# Patient Record
Sex: Female | Born: 1960 | Race: White | Hispanic: No | Smoking: Never smoker
Health system: Southern US, Community
[De-identification: ages and names within clinical notes are randomized; demographics above are authoritative.]

## PROBLEM LIST (undated history)

## (undated) HISTORY — PX: KNEE SURGERY: SHX244

---
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
## 2007-11-16 ENCOUNTER — Inpatient Hospital Stay (HOSPITAL_COMMUNITY): Admission: EM | Admit: 2007-11-16 | Discharge: 2007-11-19 | Payer: Self-pay | Admitting: Emergency Medicine

## 2007-11-18 ENCOUNTER — Ambulatory Visit: Payer: Self-pay | Admitting: Thoracic Surgery

## 2007-11-24 ENCOUNTER — Encounter: Admission: RE | Admit: 2007-11-24 | Discharge: 2007-11-24 | Payer: Self-pay | Admitting: Thoracic Surgery

## 2007-11-24 ENCOUNTER — Ambulatory Visit: Payer: Self-pay | Admitting: Thoracic Surgery

## 2007-12-14 ENCOUNTER — Ambulatory Visit: Payer: Self-pay | Admitting: Thoracic Surgery

## 2007-12-14 ENCOUNTER — Encounter: Admission: RE | Admit: 2007-12-14 | Discharge: 2007-12-14 | Payer: Self-pay | Admitting: Surgery

## 2009-07-15 IMAGING — CR DG CHEST 2V
2 series · 2 of 2 positions shown · non-contrast
Comparison: 11/24/2007

CLINICAL DATA: 47-year-old female left traumatic pneumothorax,
follow-up exam

CHEST - 2 VIEW

[view not recorded (1 of 2)]
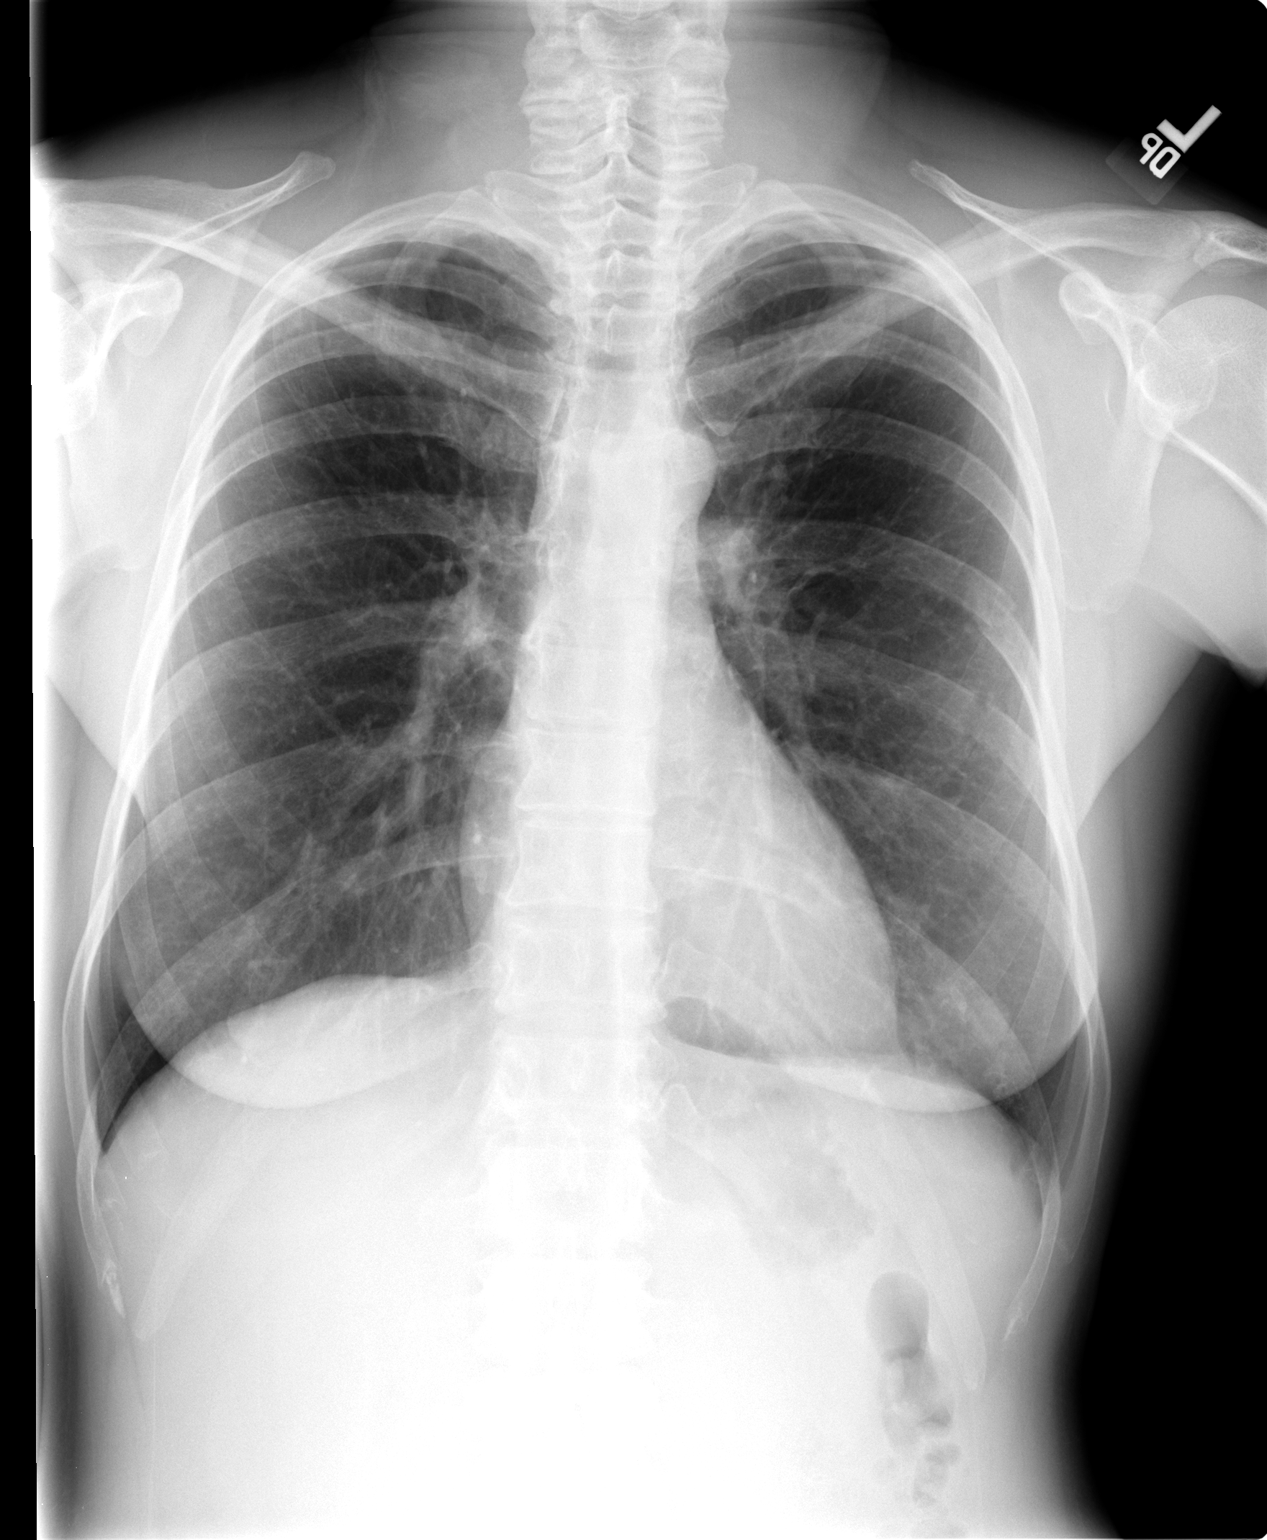

[view not recorded (2 of 2)]
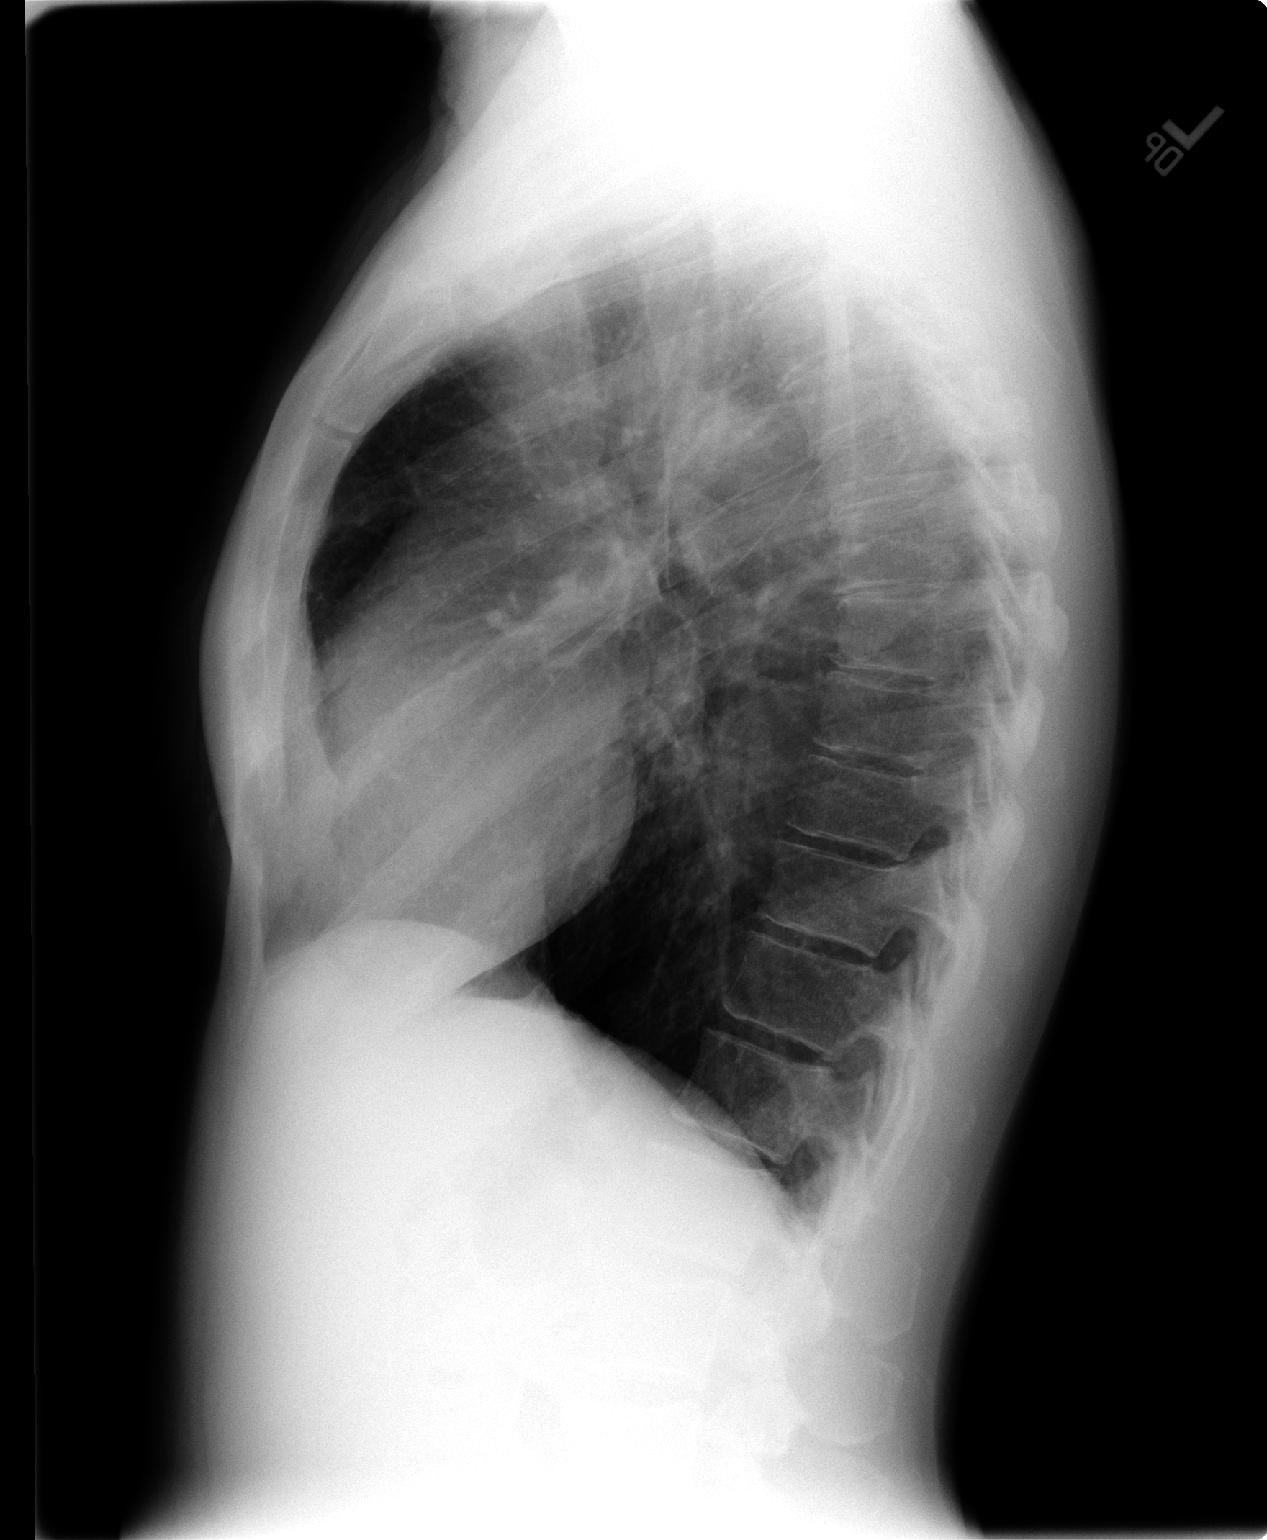

[2 of 2 positions shown; findings below may reference images not displayed]

FINDINGS: Resolved left apical pneumothorax and left chest
subcutaneous air.  Lungs are clear.  Normal heart size and
vascularity.  Lower chest symmetric nodular densities are
consistent with nipple shadows.  No current airspace disease,
edema, or effusion.  Trachea is midline.  Minimal degenerative
changes of the spine. A left posterior lateral seventh rib fracture
is again noted.
IMPRESSION: Resolved left pneumothorax and left chest subcutaneous air.

No current acute chest disease.

## 2010-07-23 NOTE — Assessment & Plan Note (Signed)
OFFICE VISIT   SICILY, ZARAGOZA  DOB:  [birth year]                                        December 14, 2007  CHART #:  [id number]   The patient comes in today for a 3-week followup.  She sustained a  traumatic left pneumothorax and was hospitalized from 11/16/2007 until  11/19/2007 for observation.  She has been stable at home and has resumed  her normal activities.  She denies any shortness of breath or chest  pain.   PHYSICAL EXAMINATION:  VITAL SIGNS:  Blood pressure is 143/89, pulse is  56, respirations 18, and O2 sat 100%.   A chest x-ray today shows complete resolution of her left pneumothorax  and all left subcutaneous air.   ASSESSMENT/PLAN:  Dr. Edwyna Shell has seen the patient and reviewed her films  today.  We will see her back as needed for followup.   Ines Bloomer, M.D.  Electronically Signed   GC/MEDQ  D:  12/14/2007  T:  12/14/2007  Job:  [id number]

## 2010-07-23 NOTE — Discharge Summary (Signed)
Alexandra Perry, Alexandra Perry            ACCOUNT NO.:  [id number]   MEDICAL RECORD NO.:  [id number]          PATIENT TYPE:  INP   LOCATION:  2022                         FACILITY:  [facility]   PHYSICIAN:  Ines Bloomer, M.D. DATE OF BIRTH:  1960/05/20   DATE OF ADMISSION:  11/16/2007  DATE OF DISCHARGE:                               DISCHARGE SUMMARY   HISTORY:  The patient is a 50 year old white female who approximately 24  hours prior to admission fell from her lifeguard platform and landed on  the left side of her chest.  She was seen in Urgent Care on November 16, 2007, and chest x-ray revealed a left-sided pneumothorax.  She  complained primarily of discomfort on her left side as well as some  bruising down to the level of her buttocks.  She denied significant  shortness of breath.  She denied cough or hemoptysis.  She had no  fevers, chills, or other constitutional symptoms.  She had some mild  abdominal discomfort.  She was seen in the emergency room and felt to  require admission for further observation.   PAST MEDICAL HISTORY:  Unremarkable.   PAST SURGICAL HISTORY:  Right knee arthroscopy and also appendectomy.   ALLERGIES:  No known drug allergies.   MEDICATIONS PRIOR TO ADMISSION:  None.   FAMILY HISTORY, SOCIAL HISTORY, REVIEW OF SYMPTOMS, AND PHYSICAL  EXAMINATION:  Please see history and physical done at the time of  admission.   HOSPITAL COURSE:  The patient was admitted.  She was seen in the  emergency room by Dr. Edwyna Shell.  It was not felt that she would require a  chest tube due to the overall small size in the pneumothorax.  A CT scan  of the chest was obtained.  This was notable from the following:  1. Approximately 15-20% left pneumothorax under mild tension.  2. Pneumomediastinum and extensive left subcutaneous emphysema.  3. Left seventh, eighth, and ninth rib fractures as well as left T8      and T9 transverse process fractures.  4. Left lower lobe pulmonary  contusion.  5. Minimal blood in the left pleural space.  6. Three small right lung nodules.  Per the radiology recommendation,      if the patient is a nonsmoker, a followup noncontrast CT would be      advised for 6-12 months, or and a smoker a followup CT without      contrast would be recommended in 6 months.  The patient is a      nonsmoker.   HOSPITAL COURSE:  The patient was admitted.  She has overall had no  significant difficulties.  She has had moderate pain, but this has been  managed with analgesia as well as muscle relaxants.  A urinalysis did  reveal blood, however, there was only 0-2 RBCs per high-powered field.  She required some hydration.  She was started on a course of incentive  spirometry and pulmonary toilet.  She has remained afebrile.  Activity  level has slowly been improving.  Overall, the patient's status was felt  to be tentatively stable for  discharge on November 19, 2007, pending  morning round reevaluation.   DISCHARGE MEDICATIONS:  1. Tylox 1-2 every 4-6 hours as needed.  2. Flexeril 10 mg 1/2 to 1 tablet every 8 hours as needed.  3. Followup will be Wednesday, November 24, 2007, at 2:45, with Dr.      Edwyna Shell with a chest x-ray.   INSTRUCTIONS:  The patient received written instructions in regard to  medications, activity, diet, wound care, and followup.   FINAL DIAGNOSIS:  Chest trauma following a fall.   OTHER DIAGNOSES:  1. Left-sided 15-20% pneumothorax, stable.  2. Pneumomediastinum.  3. Extensive subcutaneous emphysema.  4. Left seventh, eighth, and ninth rib fracture.  5. Left T8 and T9 transverse process fractures.  6. Left lower lobe pulmonary contusion.  7. Three small right lung nodules that will require followup in the      future by repeat CT scan.  8. Previous history of appendectomy.  9. Previous history of right knee arthroscopy.      Rowe Clack, P.A.-C.      Ines Bloomer, M.D.  Electronically Signed     WEG/MEDQ  D:  11/18/2007  T:  11/19/2007  Job:  [id number]   cc:   Cherylynn Ridges, M.D.

## 2010-07-23 NOTE — Assessment & Plan Note (Signed)
OFFICE VISIT   Alexandra Perry, Alexandra Perry  DOB:  12-09-1960                                        November 24, 2007  CHART #:  [id number]   The patient came today for followup of her traumatic left pneumothorax.  She had fractures of her seventh, eighth, and ninth ribs on the left  side with displacement of the eighth rib.  She develops subcutaneous  emphysema and pneumomediastinum.  Her chest x-ray today shows that her  pneumothorax has pretty much resolved and there is a decreasing  subcutaneous emphysema and no more pneumomediastinum.  Her lungs are  clear to auscultation and percussion.  Heart regular sinus rhythm.  No  murmurs.  Abdomen is soft.  She is doing well overall.  Her blood  pressure is 110/74, pulse 80, respirations 18, and sats were 99%.  I  will see her back again in 3 weeks with another chest x-ray.  I will  release her to normal activity.   Ines Bloomer, M.D.  Electronically Signed   DPB/MEDQ  D:  11/24/2007  T:  11/25/2007  Job:  [id number]

## 2010-07-23 NOTE — H&P (Signed)
Alexandra Perry, Alexandra Perry            ACCOUNT NO.:  [id number]   MEDICAL RECORD NO.:  [id number]          PATIENT TYPE:  EMS   LOCATION:  MAJO                         FACILITY:  [facility]   PHYSICIAN:  Ines Bloomer, M.D. DATE OF BIRTH:  05/15/1960   DATE OF ADMISSION:  11/16/2007  DATE OF DISCHARGE:                              HISTORY & PHYSICAL   CHIEF COMPLAINT:  Traumatic left-sided pneumothorax.   HISTORY OF PRESENT ILLNESS:  The patient is a 50 year old white female  who approximately 24 hours ago fell from her lifeguard platform and  landed on her left side of her chest.  She was seen in Urgent Care on  today's date, where a chest x-ray revealed a left-sided pneumothorax.  She complains primarily of discomfort in the left side as well as some  bruising down to the level of her buttocks.  She denies significant  shortness of breath.  She denies cough or hemoptysis.  She has had no  fevers, chills, or other constitutional symptoms.  She has some mild  abdominal discomfort.  She will be admitted for possible chest tube  placement versus observation.  She will be seen in the emergency  department by Dr. Edwyna Shell for this decision to be made.   PAST MEDICAL HISTORY:  Unremarkable.   PAST SURGICAL HISTORY:  She has had right knee arthroscopy and  appendectomy.   ALLERGIES:  No known drug allergies.   MEDICATIONS:  Prior to admission none.   REVIEW OF SYSTEMS:  See the history of present illness for pertinent  positives or negatives, otherwise unremarkable.   SOCIAL HISTORY:  She is married with two grown children.  Tobacco use,  none.  Alcohol use, one to two beers per evening.  Drug use, none.   FAMILY HISTORY:  Remarkable for father deceased from congestive heart  failure.  Father deceased from lung cancer.   PHYSICAL EXAMINATION:  VITAL SIGNS:  Blood pressure 125/73, heart rate  77, respirations 18, and temperature 99.1.  GENERAL APPEARANCE:  A 50 year old white female in  no acute distress at  rest.  HEENT:  Normocephalic, atraumatic.  Pupils equal, round, react to light.  Extraocular movements intact.  Oral mucosa is pink and moist.  Sclerae  is nonicteric.  Pharynx is clear of exudates or erythema.  NECK:  Supple.  No jugular venous distention, no bruits.  No  lymphadenopathy.  CARDIAC:  Regular rate and rhythm.  Normal S1 and S2 without murmurs,  gallops, or rubs.  PULMONARY:  Symmetrical on respiration, unlabored.  There is  subcutaneous air in the left side anterior and posterior thorax.  Breath  sounds are diminished on the left.  They are clear on the right.  There  are no wheezes, no rhonchi.  ABDOMEN:  Soft.  There is mild left upper quadrant tenderness to  palpation.  No masses, no bruits.  No guarding, no rebound.  GENITOURINARY/RECTAL:  Deferred.  EXTREMITIES:  No edema.  Peripheral pulses are equal and intact  bilaterally.  NEUROLOGIC:  Nonfocal.  She is alert and oriented x4.  Gait was not  tested.  Muscle strength was  equal bilaterally grossly.   ASSESSMENT:  Traumatic left pneumothorax.   PLAN:  Plan is for observation with possible chest tube placement after  evaluation by Dr. Edwyna Shell.      Rowe Clack, P.A.-C.      Ines Bloomer, M.D.  Electronically Signed    WEG/MEDQ  D:  11/16/2007  T:  11/17/2007  Job:  [id number]

## 2010-12-11 LAB — URINALYSIS, ROUTINE W REFLEX MICROSCOPIC
Bilirubin Urine: NEGATIVE
Glucose, UA: NEGATIVE
Ketones, ur: 40 — AB
Ketones, ur: NEGATIVE
Protein, ur: 30 — AB
Protein, ur: NEGATIVE
Protein, ur: NEGATIVE
Specific Gravity, Urine: >1.046 — ABNORMAL HIGH
Urobilinogen, UA: 1
pH: 5
pH: 5.5

## 2010-12-11 LAB — CBC
HCT: 34.2 — ABNORMAL LOW
MCHC: 35.1
MCV: 96.7
MCV: 96.7
Platelets: 243
RBC: 3.54 — ABNORMAL LOW
WBC: 9.2

## 2010-12-11 LAB — URINE MICROSCOPIC-ADD ON

## 2010-12-11 LAB — COMPREHENSIVE METABOLIC PANEL
AST: 49 — ABNORMAL HIGH
Albumin: 3.6
BUN: 8
Chloride: 108
GFR calc non Af Amer: >60
Sodium: 138

## 2010-12-11 LAB — BASIC METABOLIC PANEL
CO2: 26
Calcium: 8.6
Chloride: 106
GFR calc Af Amer: >60
Potassium: 4.3
Sodium: 137

## 2010-12-11 LAB — PROTIME-INR: INR: 1

## 2010-12-11 LAB — DIFFERENTIAL
Basophils Relative: 0
Neutrophils Relative %: 83 — ABNORMAL HIGH

## 2010-12-11 LAB — APTT: aPTT: 25

## 2016-06-19 ENCOUNTER — Ambulatory Visit (INDEPENDENT_AMBULATORY_CARE_PROVIDER_SITE_OTHER): Payer: BLUE CROSS/BLUE SHIELD | Admitting: Nurse Practitioner

## 2016-06-19 ENCOUNTER — Encounter: Payer: Self-pay | Admitting: Nurse Practitioner

## 2016-06-19 VITALS — BP 122/84 | HR 69 | Temp 98.3°F | Ht 63.0 in | Wt 145.0 lb

## 2016-06-19 DIAGNOSIS — G8929 Other chronic pain: Secondary | ICD-10-CM

## 2016-06-19 DIAGNOSIS — Z0000 Encounter for general adult medical examination without abnormal findings: Secondary | ICD-10-CM

## 2016-06-19 DIAGNOSIS — M25561 Pain in right knee: Secondary | ICD-10-CM | POA: Diagnosis not present

## 2016-06-19 DIAGNOSIS — R479 Unspecified speech disturbances: Secondary | ICD-10-CM | POA: Diagnosis not present

## 2016-06-19 DIAGNOSIS — M25562 Pain in left knee: Secondary | ICD-10-CM

## 2016-06-19 NOTE — Progress Notes (Signed)
Pre visit review using our clinic review tool, if applicable. No additional management support is needed unless otherwise documented below in the visit note. 

## 2016-06-19 NOTE — Progress Notes (Signed)
Subjective:    Patient ID: Alexandra Perry, female    DOB: 1960/10/24, 56 y.o.   MRN: [id number]  Patient presents today for establish care (new patient)  HPI  no previous pcp.  Speech Disturbance: Experienced a period of aphasia and light sensitvity while at work, happened 4weeks ago, lasted for about . No change in LOC, no change in vision, no weakness or fatigue. Was not witnessed by anyone. No FH of CAD or CVA or TIA or seizure. No personal hx of HA or migraines  Knee pain: Ongoing for several years, bilateral knee pain with stffness and clicking sounds.  Left knee worse than right. No knee injury, no swelling, no redness. Exercises regularly (elliptical and stationary bike).  Immunizations: (TDAP, Hep C screen, Pneumovax, Influenza, zoster)  Health Maintenance  Topic Date Due  .  Hepatitis C: One time screening is recommended by Center for Disease Control  (CDC) for  adults born from [birth year] through 1967.   September 15, 1960  . HIV Screening  04/25/1973  . Pap Smear  04/26/1979  . Mammogram  04/25/2008  . Colon Cancer Screening  04/25/2008  . Tetanus Vaccine  10/08/2008  . Flu Shot  10/08/2016   Diet:healthy Weight:  Wt Readings from Last 3 Encounters:  06/19/16 145 lb (65.8 kg)   Exercise:daily Fall Risk: Fall Risk  06/19/2016  Falls in the past year? No   Depression/Suicide: Depression screen PHQ 2/9 06/19/2016  Decreased Interest 0  Down, Depressed, Hopeless 0  PHQ - 2 Score 0   Medications and allergies reviewed with patient and updated if appropriate.  Patient Active Problem List   Diagnosis Date Noted  . Speech disturbance 06/19/2016  . Chronic pain of both knees 06/19/2016    No current outpatient prescriptions on file prior to visit.   No current facility-administered medications on file prior to visit.     History reviewed. No pertinent past medical history.  Past Surgical History:  Procedure Laterality Date  . APPENDECTOMY  1966  . KNEE  SURGERY      Social History   Social History  . Marital status: Divorced    Spouse name: N/A  . Number of children: N/A  . Years of education: N/A   Social History Main Topics  . Smoking status: Never Smoker  . Smokeless tobacco: Never Used  . Alcohol use 0.6 oz/week    1 Cans of beer per week     Comment: social  . Drug use: No  . Sexual activity: Not Currently   Other Topics Concern  . None   Social History Narrative  . None    Family History  Problem Relation Age of Onset  . Cancer Mother     lung cancer  . Heart disease Father 69    CAD , no MI with CABG  . Alcohol abuse Sister   . Eating disorder Sister   . Drug abuse Sister   . Parkinson's disease Maternal Grandmother 60        Review of Systems  Constitutional: Negative for fever, malaise/fatigue and weight loss.  HENT: Negative for congestion, hearing loss, sore throat and tinnitus.   Eyes: Negative for blurred vision and photophobia.       Negative for visual changes  Respiratory: Negative for cough and shortness of breath.   Cardiovascular: Negative for chest pain, palpitations and leg swelling.  Gastrointestinal: Negative for blood in stool, constipation, diarrhea and heartburn.  Genitourinary: Negative for dysuria, frequency and urgency.  Musculoskeletal:  Positive for joint pain. Negative for falls and myalgias.  Skin: Negative for rash.  Neurological: Negative for dizziness, sensory change, weakness and headaches.  Endo/Heme/Allergies: Negative for environmental allergies. Does not bruise/bleed easily.  Psychiatric/Behavioral: Negative for depression, substance abuse and suicidal ideas. The patient is not nervous/anxious.     Objective:   Vitals:   06/19/16 0913  BP: 122/84  Pulse: 69  Temp: 98.3 F (36.8 C)    Body mass index is 25.69 kg/m.   Physical Examination:  Physical Exam  Constitutional: She is oriented to person, place, and time and well-developed, well-nourished, and in  no distress. No distress.  HENT:  Right Ear: External ear normal.  Left Ear: External ear normal.  Nose: Nose normal.  Mouth/Throat: Oropharynx is clear and moist. No oropharyngeal exudate.  Eyes: Conjunctivae and EOM are normal. Pupils are equal, round, and reactive to light. No scleral icterus.  Neck: Normal range of motion. Neck supple. No thyromegaly present.  Cardiovascular: Normal rate, normal heart sounds and intact distal pulses.   Pulmonary/Chest: Effort normal and breath sounds normal. She exhibits no tenderness.  Abdominal: Soft. Bowel sounds are normal. She exhibits no distension. There is no tenderness.  Musculoskeletal: Normal range of motion. She exhibits no edema or tenderness.       Right knee: Normal.       Left knee: Normal.  Lymphadenopathy:    She has no cervical adenopathy.  Neurological: She is alert and oriented to person, place, and time. Gait normal.  Skin: Skin is warm and dry.  Psychiatric: Affect and judgment normal.  Vitals reviewed.   ASSESSMENT and PLAN:  Mosella was seen today for establish care.  Diagnoses and all orders for this visit:  Encounter for medical examination to establish care -     Cancel: Lipid panel; Future -     Cancel: Comprehensive metabolic panel; Future -     Cancel: TSH; Future  Speech disturbance, unspecified type -     Cancel: Lipid panel; Future -     Cancel: Comprehensive metabolic panel; Future -     Cancel: TSH; Future  Chronic pain of both knees   No problem-specific Assessment & Plan notes found for this encounter.     Follow up: Return in about 7 days (around 06/26/2016) for CPE (fasting, breast exam and PAP).  Alysia Penna, NP

## 2016-06-19 NOTE — Patient Instructions (Addendum)
She denied any labs and head CT today due to possible high copay She also denied referral to ortho or PT or sports medicine to address knee pain.  Aphasia Aphasia is damage to the part of your brain that you need to communicate. For most people, that area is on the left side of the brain. Aphasia does not affect your intelligence, but you may struggle to talk, understand speech, read, or write. Aphasia can happen to anyone at any age, but it is most common in older age. What are the causes? An interruption of blood supply to the brain (stroke) is the most common cause of aphasia. Any disease or disorder that damages the communication areas of the brain can cause aphasia. This includes:  Brain tumors.  Brain injuries.  Brain infections.  Progressive diseases of the nervous system (neurological disorders). What increases the risk? You may be at risk for aphasia if you have had any trauma, disease, or disorder that damaged the communication areas of the brain. What are the signs or symptoms? Aphasia may start suddenly if it is caused by a stroke or brain injury. Aphasia caused by a tumor or a progressive neurological disorder may start gradually. The condition affects people differently. Signs and symptoms of aphasia include:  Trouble finding the right word.  Using the wrong words.  Talking in sentences that do not make sense.  Making up words.  Being unable to understand other people's speech.  Having problems writing, spelling, or reading.  Having trouble with numbers.  Having trouble swallowing. How is this diagnosed? Your health care provider may suspect you have aphasia if you lose the ability to speak or understand language. You may need to see a specialist (speech and language pathologist) to help determine the diagnosis of aphasia. This person may do a series of tests to check your ability to:  Speak.  Express ideas.  Make conversation.  Understand speech.  Read and  write. How is this treated? In some cases, aphasia may improve on its own over time. Treatment for aphasia usually involves therapy with a pathologist. Your treatment will be designed to meet your needs and abilities. Common treatments include:  Speech therapy.  Learning other ways to communicate.  Working with family members to find the best ways to communicate.  Working with an occupational therapist to find ways to communicate at work. Follow these instructions at home:  Keep all follow-up appointments.  Make sure you have a good support system at home.  The following techniques may be helpful while communicating:  Use short, simple sentences. Ask family members to do the same. Sentences that require one-word answers are easiest.  Avoid distractions like background noise when trying to listen or talk.  Try communicating with gestures, pointing, or drawing.  Talk slowly. Ask family members to talk to you slowly.  Maintain eye contact when communicating. Contact a health care provider if:  Your symptoms change or get worse.  You need more support at home.  You are struggling with anxiety or depression.  You develop trouble swallowing. This information is not intended to replace advice given to you by your health care provider. Make sure you discuss any questions you have with your health care provider. Document Released: 11/16/2001 Document Revised: 09/14/2015 Document Reviewed: 05/16/2013 Elsevier Interactive Patient Education  2017 [employer].

## 2016-06-26 ENCOUNTER — Encounter: Payer: Self-pay | Admitting: Nurse Practitioner

## 2016-06-26 ENCOUNTER — Other Ambulatory Visit (INDEPENDENT_AMBULATORY_CARE_PROVIDER_SITE_OTHER): Payer: BLUE CROSS/BLUE SHIELD

## 2016-06-26 ENCOUNTER — Ambulatory Visit (INDEPENDENT_AMBULATORY_CARE_PROVIDER_SITE_OTHER): Payer: BLUE CROSS/BLUE SHIELD | Admitting: Nurse Practitioner

## 2016-06-26 VITALS — BP 124/80 | HR 81 | Temp 98.1°F | Ht 63.0 in | Wt 147.0 lb

## 2016-06-26 DIAGNOSIS — Z0000 Encounter for general adult medical examination without abnormal findings: Secondary | ICD-10-CM

## 2016-06-26 DIAGNOSIS — Z1231 Encounter for screening mammogram for malignant neoplasm of breast: Secondary | ICD-10-CM | POA: Diagnosis not present

## 2016-06-26 DIAGNOSIS — Z136 Encounter for screening for cardiovascular disorders: Secondary | ICD-10-CM

## 2016-06-26 DIAGNOSIS — Z13220 Encounter for screening for lipoid disorders: Secondary | ICD-10-CM

## 2016-06-26 LAB — COMPREHENSIVE METABOLIC PANEL
ALBUMIN: 4.4 g/dL (ref 3.5–5.2)
ALT: 17 U/L (ref 0–35)
AST: 18 U/L (ref 0–37)
Alkaline Phosphatase: 73 U/L (ref 39–117)
BUN: 10 mg/dL (ref 6–23)
CALCIUM: 9.4 mg/dL (ref 8.4–10.5)
CHLORIDE: 101 meq/L (ref 96–112)
CO2: 26 meq/L (ref 19–32)
Creatinine, Ser: 0.84 mg/dL (ref 0.40–1.20)
GFR: 73.97 mL/min (ref >60.00)
Glucose, Bld: 90 mg/dL (ref 70–99)
POTASSIUM: 4.3 meq/L (ref 3.5–5.1)
SODIUM: 135 meq/L (ref 135–145)
TOTAL PROTEIN: 7.2 g/dL (ref 6.0–8.3)
Total Bilirubin: 0.5 mg/dL (ref 0.2–1.2)

## 2016-06-26 LAB — CBC WITH DIFFERENTIAL/PLATELET
BASOS PCT: 0.2 % (ref 0.0–3.0)
Basophils Absolute: 0 10*3/uL (ref 0.0–0.1)
EOS PCT: 2.9 % (ref 0.0–5.0)
Eosinophils Absolute: 0.2 10*3/uL (ref 0.0–0.7)
HCT: 36.4 % (ref 36.0–46.0)
HEMOGLOBIN: 12.6 g/dL (ref 12.0–15.0)
Lymphocytes Relative: 23.8 % (ref 12.0–46.0)
Lymphs Abs: 1.5 10*3/uL (ref 0.7–4.0)
MCHC: 34.8 g/dL (ref 30.0–36.0)
MCV: 91.9 fl (ref 78.0–100.0)
MONO ABS: 0.4 10*3/uL (ref 0.1–1.0)
Monocytes Relative: 7 % (ref 3.0–12.0)
Neutro Abs: 4.1 10*3/uL (ref 1.4–7.7)
Neutrophils Relative %: 66.1 % (ref 43.0–77.0)
Platelets: 323 10*3/uL (ref 150.0–400.0)
RBC: 3.96 Mil/uL (ref 3.87–5.11)
RDW: 13.6 % (ref 11.5–15.5)
WBC: 6.2 10*3/uL (ref 4.0–10.5)

## 2016-06-26 LAB — LIPID PANEL
CHOL/HDL RATIO: 3
Cholesterol: 251 mg/dL — ABNORMAL HIGH (ref 0–200)
HDL: 80.1 mg/dL (ref >39.00)
LDL CALC: 158 mg/dL — AB (ref 0–99)
NONHDL: 171.35
Triglycerides: 68 mg/dL (ref 0.0–149.0)
VLDL: 13.6 mg/dL (ref 0.0–40.0)

## 2016-06-26 LAB — TSH: TSH: 2.29 u[IU]/mL (ref 0.35–4.50)

## 2016-06-26 NOTE — Progress Notes (Signed)
Pre visit review using our clinic review tool, if applicable. No additional management support is needed unless otherwise documented below in the visit note. 

## 2016-06-26 NOTE — Progress Notes (Signed)
Subjective:    Patient ID: Alexandra Perry, female    DOB: 06-25-1960, 56 y.o.   MRN: [id number]  Patient presents today for complete physical.  HPI  denies any acute complaints.  Immunizations: (TDAP, Hep C screen, Pneumovax, Influenza, zoster)  Health Maintenance  Topic Date Due  . Mammogram  04/25/2008  . Pap Smear  06/08/2017*  . Colon Cancer Screening  06/08/2017*  . Tetanus Vaccine  06/08/2017*  .  Hepatitis C: One time screening is recommended by Center for Disease Control  (CDC) for  adults born from [birth year] through 1965.   06/08/2017*  . HIV Screening  06/08/2017*  . Flu Shot  10/08/2016  *Topic was postponed. The date shown is not the original due date.   Diet:regular Weight:  Wt Readings from Last 3 Encounters:  06/26/16 147 lb (66.7 kg)  06/19/16 145 lb (65.8 kg)   Exercise:none Fall Risk: Fall Risk  06/19/2016  Falls in the past year? No   Home Safety:home alone Depression/Suicide: Depression screen [facility] 2/9 06/19/2016  Decreased Interest 0  Down, Depressed, Hopeless 0  PHQ - 2 Score 0   No flowsheet data found. Colonoscopy (every 5-41yrs, >50-50yrs):needed, but declined Pap Smear (every 71yrs for >21-29 without HPV, every 32yrs for >30-66yrs with HPV):needed but declined Mammogram (yearly, >48yrs):needed Vision:needed Dental:needed Sexual History (birth control, marital status, STD):not sexually active  Medications and allergies reviewed with patient and updated if appropriate.  Patient Active Problem List   Diagnosis Date Noted  . Speech disturbance 06/19/2016  . Chronic pain of both knees 06/19/2016    Current Outpatient Prescriptions on File Prior to Visit  Medication Sig Dispense Refill  . aspirin 325 MG tablet Take 325 mg by mouth daily.     No current facility-administered medications on file prior to visit.     No past medical history on file.  Past Surgical History:  Procedure Laterality Date  . APPENDECTOMY  1966  . KNEE SURGERY       Social History   Social History  . Marital status: Divorced    Spouse name: N/A  . Number of children: N/A  . Years of education: N/A   Social History Main Topics  . Smoking status: Never Smoker  . Smokeless tobacco: Never Used  . Alcohol use 0.6 oz/week    1 Cans of beer per week     Comment: social  . Drug use: No  . Sexual activity: Not Currently   Other Topics Concern  . None   Social History Narrative  . None    Family History  Problem Relation Age of Onset  . Cancer Mother     lung cancer  . Heart disease Father 12    CAD , no MI with CABG  . Alcohol abuse Sister   . Eating disorder Sister   . Drug abuse Sister   . Parkinson's disease Maternal Grandmother 60        Review of Systems  Constitutional: Negative for fever, malaise/fatigue and weight loss.  HENT: Negative for congestion and sore throat.   Eyes:       Negative for visual changes  Respiratory: Negative for cough and shortness of breath.   Cardiovascular: Negative for chest pain, palpitations and leg swelling.  Gastrointestinal: Negative for blood in stool, constipation, diarrhea and heartburn.  Genitourinary: Negative for dysuria, frequency and urgency.  Musculoskeletal: Positive for joint pain. Negative for falls and myalgias.  Skin: Negative for rash.  Neurological: Negative for dizziness,  sensory change and headaches.  Endo/Heme/Allergies: Does not bruise/bleed easily.  Psychiatric/Behavioral: Negative for depression, substance abuse and suicidal ideas. The patient is not nervous/anxious.     Objective:   Vitals:   06/26/16 0830  BP: 124/80  Pulse: 81  Temp: 98.1 F (36.7 C)    Body mass index is 26.04 kg/m.   Physical Examination:  Physical Exam  Constitutional: She is oriented to person, place, and time and well-developed, well-nourished, and in no distress. No distress.  HENT:  Right Ear: External ear normal.  Left Ear: External ear normal.  Nose: Nose normal.    Mouth/Throat: No oropharyngeal exudate.  Eyes: Conjunctivae and EOM are normal. Pupils are equal, round, and reactive to light. No scleral icterus.  Neck: Normal range of motion. Neck supple. No thyromegaly present.  Cardiovascular: Normal rate, regular rhythm, normal heart sounds and intact distal pulses.   Pulmonary/Chest: Effort normal and breath sounds normal. She exhibits no tenderness. Right breast exhibits no inverted nipple, no mass, no nipple discharge, no skin change and no tenderness. Left breast exhibits no inverted nipple, no mass, no nipple discharge, no skin change and no tenderness. Breasts are symmetrical.  Abdominal: Soft. Bowel sounds are normal. She exhibits no distension. There is no tenderness.  Musculoskeletal: Normal range of motion. She exhibits no edema or tenderness.  Lymphadenopathy:    She has no cervical adenopathy.  Neurological: She is alert and oriented to person, place, and time. Gait normal.  Skin: Skin is warm and dry.  Psychiatric: Affect and judgment normal.  Vitals reviewed.   ASSESSMENT and PLAN:  Allean was seen today for annual exam.  Diagnoses and all orders for this visit:  Encounter for preventative adult health care examination -     CBC w/Diff; Future -     Comprehensive metabolic panel; Future -     TSH; Future -     Lipid panel; Future -     MM DIGITAL SCREENING BILATERAL; Future  Encounter for lipid screening for cardiovascular disease -     Lipid panel; Future  Encounter for screening mammogram for breast cancer -     MM DIGITAL SCREENING BILATERAL; Future    No problem-specific Assessment & Plan notes found for this encounter.     Follow up: Return in about 6 months (around 12/26/2016) for speech disturbance and knee pain.  Alysia Penna, NP

## 2016-06-26 NOTE — Patient Instructions (Signed)
You will be contact about lab results.  Health Maintenance, Female Adopting a healthy lifestyle and getting preventive care can go a long way to promote health and wellness. Talk with your health care provider about what schedule of regular examinations is right for you. This is a good chance for you to check in with your provider about disease prevention and staying healthy. In between checkups, there are plenty of things you can do on your own. Experts have done a lot of research about which lifestyle changes and preventive measures are most likely to keep you healthy. Ask your health care provider for more information. Weight and diet Eat a healthy diet  Be sure to include plenty of vegetables, fruits, low-fat dairy products, and lean protein.  Do not eat a lot of foods high in solid fats, added sugars, or salt.  Get regular exercise. This is one of the most important things you can do for your health.  Most adults should exercise for at least 150 minutes each week. The exercise should increase your heart rate and make you sweat (moderate-intensity exercise).  Most adults should also do strengthening exercises at least twice a week. This is in addition to the moderate-intensity exercise. Maintain a healthy weight  Body mass index (BMI) is a measurement that can be used to identify possible weight problems. It estimates body fat based on height and weight. Your health care provider can help determine your BMI and help you achieve or maintain a healthy weight.  For females 50 years of age and older:  A BMI below 18.5 is considered underweight.  A BMI of 18.5 to 24.9 is normal.  A BMI of 25 to 29.9 is considered overweight.  A BMI of 30 and above is considered obese. Watch levels of cholesterol and blood lipids  You should start having your blood tested for lipids and cholesterol at 58 years of age, then have this test every 5 years.  You may need to have your cholesterol levels  checked more often if:  Your lipid or cholesterol levels are high.  You are older than 58 years of age.  You are at high risk for heart disease. Cancer screening Lung Cancer  Lung cancer screening is recommended for adults 54-78 years old who are at high risk for lung cancer because of a history of smoking.  A yearly low-dose CT scan of the lungs is recommended for people who:  Currently smoke.  Have quit within the past 15 years.  Have at least a 30-pack-year history of smoking. A pack year is smoking an average of one pack of cigarettes a day for 1 year.  Yearly screening should continue until it has been 15 years since you quit.  Yearly screening should stop if you develop a health problem that would prevent you from having lung cancer treatment. Breast Cancer  Practice breast self-awareness. This means understanding how your breasts normally appear and feel.  It also means doing regular breast self-exams. Let your health care provider know about any changes, no matter how small.  If you are in your 20s or 30s, you should have a clinical breast exam (CBE) by a health care provider every 1-3 years as part of a regular health exam.  If you are 94 or older, have a CBE every year. Also consider having a breast X-ray (mammogram) every year.  If you have a family history of breast cancer, talk to your health care provider about genetic screening.  If you  are at high risk for breast cancer, talk to your health care provider about having an MRI and a mammogram every year.  Breast cancer gene (BRCA) assessment is recommended for women who have family members with BRCA-related cancers. BRCA-related cancers include:  Breast.  Ovarian.  Tubal.  Peritoneal cancers.  Results of the assessment will determine the need for genetic counseling and BRCA1 and BRCA2 testing. Cervical Cancer  Your health care provider may recommend that you be screened regularly for cancer of the pelvic  organs (ovaries, uterus, and vagina). This screening involves a pelvic examination, including checking for microscopic changes to the surface of your cervix (Pap test). You may be encouraged to have this screening done every 3 years, beginning at age 46.  For women ages 24-65, health care providers may recommend pelvic exams and Pap testing every 3 years, or they may recommend the Pap and pelvic exam, combined with testing for human papilloma virus (HPV), every 5 years. Some types of HPV increase your risk of cervical cancer. Testing for HPV may also be done on women of any age with unclear Pap test results.  Other health care providers may not recommend any screening for nonpregnant women who are considered low risk for pelvic cancer and who do not have symptoms. Ask your health care provider if a screening pelvic exam is right for you.  If you have had past treatment for cervical cancer or a condition that could lead to cancer, you need Pap tests and screening for cancer for at least 20 years after your treatment. If Pap tests have been discontinued, your risk factors (such as having a new sexual partner) need to be reassessed to determine if screening should resume. Some women have medical problems that increase the chance of getting cervical cancer. In these cases, your health care provider may recommend more frequent screening and Pap tests. Colorectal Cancer  This type of cancer can be detected and often prevented.  Routine colorectal cancer screening usually begins at 58 years of age and continues through 58 years of age.  Your health care provider may recommend screening at an earlier age if you have risk factors for colon cancer.  Your health care provider may also recommend using home test kits to check for hidden blood in the stool.  A small camera at the end of a tube can be used to examine your colon directly (sigmoidoscopy or colonoscopy). This is done to check for the earliest forms  of colorectal cancer.  Routine screening usually begins at age 35.  Direct examination of the colon should be repeated every 5-10 years through 58 years of age. However, you may need to be screened more often if early forms of precancerous polyps or small growths are found. Skin Cancer  Check your skin from head to toe regularly.  Tell your health care provider about any new moles or changes in moles, especially if there is a change in a mole's shape or color.  Also tell your health care provider if you have a mole that is larger than the size of a pencil eraser.  Always use sunscreen. Apply sunscreen liberally and repeatedly throughout the day.  Protect yourself by wearing long sleeves, pants, a wide-brimmed hat, and sunglasses whenever you are outside. Heart disease, diabetes, and high blood pressure  High blood pressure causes heart disease and increases the risk of stroke. High blood pressure is more likely to develop in:  People who have blood pressure in the high end  of the normal range (130-139/85-89 mm Hg).  People who are overweight or obese.  People who are African American.  If you are 45-38 years of age, have your blood pressure checked every 3-5 years. If you are 63 years of age or older, have your blood pressure checked every year. You should have your blood pressure measured twice-once when you are at a hospital or clinic, and once when you are not at a hospital or clinic. Record the average of the two measurements. To check your blood pressure when you are not at a hospital or clinic, you can use:  An automated blood pressure machine at a pharmacy.  A home blood pressure monitor.  If you are between 62 years and 66 years old, ask your health care provider if you should take aspirin to prevent strokes.  Have regular diabetes screenings. This involves taking a blood sample to check your fasting blood sugar level.  If you are at a normal weight and have a low risk for  diabetes, have this test once every three years after 58 years of age.  If you are overweight and have a high risk for diabetes, consider being tested at a younger age or more often. Preventing infection Hepatitis B  If you have a higher risk for hepatitis B, you should be screened for this virus. You are considered at high risk for hepatitis B if:  You were born in a country where hepatitis B is common. Ask your health care provider which countries are considered high risk.  Your parents were born in a high-risk country, and you have not been immunized against hepatitis B (hepatitis B vaccine).  You have HIV or AIDS.  You use needles to inject street drugs.  You live with someone who has hepatitis B.  You have had sex with someone who has hepatitis B.  You get hemodialysis treatment.  You take certain medicines for conditions, including cancer, organ transplantation, and autoimmune conditions. Hepatitis C  Blood testing is recommended for:  Everyone born from [birth year] through 1967.  Anyone with known risk factors for hepatitis C. Sexually transmitted infections (STIs)  You should be screened for sexually transmitted infections (STIs) including gonorrhea and chlamydia if:  You are sexually active and are younger than 58 years of age.  You are older than 58 years of age and your health care provider tells you that you are at risk for this type of infection.  Your sexual activity has changed since you were last screened and you are at an increased risk for chlamydia or gonorrhea. Ask your health care provider if you are at risk.  If you do not have HIV, but are at risk, it may be recommended that you take a prescription medicine daily to prevent HIV infection. This is called pre-exposure prophylaxis (PrEP). You are considered at risk if:  You are sexually active and do not regularly use condoms or know the HIV status of your partner(s).  You take drugs by injection.  You are  sexually active with a partner who has HIV. Talk with your health care provider about whether you are at high risk of being infected with HIV. If you choose to begin PrEP, you should first be tested for HIV. You should then be tested every 3 months for as long as you are taking PrEP. Pregnancy  If you are premenopausal and you may become pregnant, ask your health care provider about preconception counseling.  If you may become pregnant, take  400 to 800 micrograms (mcg) of folic acid every day.  If you want to prevent pregnancy, talk to your health care provider about birth control (contraception). Osteoporosis and menopause  Osteoporosis is a disease in which the bones lose minerals and strength with aging. This can result in serious bone fractures. Your risk for osteoporosis can be identified using a bone density scan.  If you are 105 years of age or older, or if you are at risk for osteoporosis and fractures, ask your health care provider if you should be screened.  Ask your health care provider whether you should take a calcium or vitamin D supplement to lower your risk for osteoporosis.  Menopause may have certain physical symptoms and risks.  Hormone replacement therapy may reduce some of these symptoms and risks. Talk to your health care provider about whether hormone replacement therapy is right for you. Follow these instructions at home:  Schedule regular health, dental, and eye exams.  Stay current with your immunizations.  Do not use any tobacco products including cigarettes, chewing tobacco, or electronic cigarettes.  If you are pregnant, do not drink alcohol.  If you are breastfeeding, limit how much and how often you drink alcohol.  Limit alcohol intake to no more than 1 drink per day for nonpregnant women. One drink equals 12 ounces of beer, 5 ounces of wine, or 1 ounces of hard liquor.  Do not use street drugs.  Do not share needles.  Ask your health care  provider for help if you need support or information about quitting drugs.  Tell your health care provider if you often feel depressed.  Tell your health care provider if you have ever been abused or do not feel safe at home. This information is not intended to replace advice given to you by your health care provider. Make sure you discuss any questions you have with your health care provider. Document Released: 09/09/2010 Document Revised: 08/02/2015 Document Reviewed: 11/28/2014 Elsevier Interactive Patient Education  2017 [employer].

## 2019-01-05 ENCOUNTER — Other Ambulatory Visit: Payer: Self-pay | Admitting: Nurse Practitioner

## 2019-01-05 DIAGNOSIS — Z1231 Encounter for screening mammogram for malignant neoplasm of breast: Secondary | ICD-10-CM

## 2019-03-14 ENCOUNTER — Ambulatory Visit: Payer: Managed Care, Other (non HMO) | Attending: Internal Medicine

## 2019-03-14 DIAGNOSIS — Z20822 Contact with and (suspected) exposure to covid-19: Secondary | ICD-10-CM

## 2019-03-15 LAB — NOVEL CORONAVIRUS, NAA: SARS-CoV-2, NAA: NOT DETECTED

## 2024-04-15 ENCOUNTER — Ambulatory Visit: Admitting: Internal Medicine

## 2024-04-28 ENCOUNTER — Ambulatory Visit: Admitting: Internal Medicine
